# Patient Record
Sex: Female | Born: 1995 | Race: Black or African American | Hispanic: No | Marital: Single | Smoking: Former smoker
Health system: Southern US, Community
[De-identification: ages and names within clinical notes are randomized; demographics above are authoritative.]

---

## 2016-02-07 ENCOUNTER — Encounter (HOSPITAL_COMMUNITY): Payer: Self-pay

## 2016-02-07 ENCOUNTER — Emergency Department (HOSPITAL_COMMUNITY)
Admission: EM | Admit: 2016-02-07 | Discharge: 2016-02-07 | Disposition: A | Discharge status: Home / Self Care | Payer: Medicaid Other | Attending: Physician Assistant | Admitting: Physician Assistant

## 2016-02-07 DIAGNOSIS — Z87891 Personal history of nicotine dependence: Secondary | ICD-10-CM | POA: Diagnosis not present

## 2016-02-07 DIAGNOSIS — N3001 Acute cystitis with hematuria: Secondary | ICD-10-CM | POA: Insufficient documentation

## 2016-02-07 DIAGNOSIS — R300 Dysuria: Secondary | ICD-10-CM | POA: Diagnosis present

## 2016-02-07 LAB — URINALYSIS, ROUTINE W REFLEX MICROSCOPIC
GLUCOSE, UA: NEGATIVE mg/dL
Ketones, ur: 15 mg/dL — AB
NITRITE: POSITIVE — AB
PH: 6 (ref 5.0–8.0)
Protein, ur: 100 mg/dL — AB
SPECIFIC GRAVITY, URINE: 1.036 — AB (ref 1.005–1.030)

## 2016-02-07 LAB — URINE MICROSCOPIC-ADD ON

## 2016-02-07 MED ORDER — CEPHALEXIN 250 MG PO CAPS
500.0000 mg | ORAL_CAPSULE | Freq: Once | ORAL | Status: AC
  Administered 2016-02-07: 500 mg via ORAL
  Filled 2016-02-07: qty 2

## 2016-02-07 MED ORDER — CEPHALEXIN 500 MG PO CAPS: 500.0000 mg | ORAL_CAPSULE | Freq: Four times a day (QID) | ORAL | 0 refills | Status: AC

## 2016-02-07 MED ORDER — PHENAZOPYRIDINE HCL 200 MG PO TABS: 200.0000 mg | ORAL_TABLET | Freq: Three times a day (TID) | ORAL | 0 refills | Status: AC

## 2016-02-07 NOTE — ED Triage Notes (Signed)
Onset 2-3 days ago dysuria.  No back pain, fever, or back pain.  Took ibuprofen @ 2p with no relief.

## 2016-02-07 NOTE — Discharge Instructions (Signed)
You're diagnosed urinary tract infection. Please fill and take prescription. Please return with any fevers, nausea, vomiting or other concerns.

## 2016-02-07 NOTE — ED Provider Notes (Signed)
[facility]-EMERGENCY DEPT Provider Note   CSN: [id number] Arrival date & time: 02/07/16  1634  By signing my name below, I, Alyssa GroveMartin Green, attest that this documentation has been prepared under the direction and in the presence of Jeff Frieden Randall AnLyn Maloni Musleh, MD. Electronically Signed: Alyssa GroveMartin Green, ED Scribe. 02/07/16. 6:09 PM.   History   Chief Complaint Chief Complaint  Patient presents with  . Dysuria   The history is provided by the patient. No language interpreter was used.   HPI Comments: Tally JoeKyja Dacey is a 20 y.o. female who presents to the Emergency Department complaining of dysuria onset 2-3 days. She reports associated abdominal spasms. Pt deies nausea, vomiting, fever, back pain, abnormal discharge, unusual urine odor, or itch. She has not taken any OTC medications.  History reviewed. No pertinent past medical history.  There are no active problems to display for this patient.   History reviewed. No pertinent surgical history.  OB History    No data available       Home Medications    Prior to Admission medications   Not on File    Family History History reviewed. No pertinent family history.  Social History Social History  Substance Use Topics  . Smoking status: Former Games developermoker  . Smokeless tobacco: Never Used  . Alcohol use Not on file     Allergies   Patient has no known allergies.   Review of Systems Review of Systems  Constitutional: Negative for fever.  Gastrointestinal: Negative for nausea and vomiting.  Genitourinary: Positive for dysuria. Negative for vaginal discharge.  Musculoskeletal: Negative for back pain.  All other systems reviewed and are negative.    Physical Exam Updated Vital Signs BP 137/89   Pulse (!) 56   Temp 98.6 F (37 C) (Oral)   Resp 16   Ht 5' 6.5" (1.689 m)   Wt 203 lb (92.1 kg)   LMP 02/07/2016   SpO2 100%   BMI 32.27 kg/m   Physical Exam  Constitutional: She is oriented to person, place, and time. She appears  well-developed and well-nourished. She is active. No distress.  HENT:  Head: Normocephalic and atraumatic.  Eyes: Conjunctivae are normal.  Cardiovascular: Normal rate and regular rhythm.   Pulmonary/Chest: Effort normal and breath sounds normal. No respiratory distress.  Abdominal: Soft. She exhibits no distension. There is no tenderness.  Musculoskeletal: Normal range of motion.  Neurological: She is alert and oriented to person, place, and time.  Skin: Skin is warm and dry.  Psychiatric: She has a normal mood and affect. Her behavior is normal.  Nursing note and vitals reviewed.  ED Treatments / Results  DIAGNOSTIC STUDIES: Oxygen Saturation is 100% on RA, normal by my interpretation.    COORDINATION OF CARE: 6:09 PM Discussed treatment plan with pt at bedside which includes Keflex and pt agreed to plan.  Labs (all labs ordered are listed, but only abnormal results are displayed) Labs Reviewed  URINALYSIS, ROUTINE W REFLEX MICROSCOPIC (NOT AT [facility])    EKG  EKG Interpretation None       Radiology No results found.  Procedures Procedures (including critical care time)  Medications Ordered in ED Medications - No data to display   Initial Impression / Assessment and Plan / ED Course  I have reviewed the triage vital signs and the nursing notes.  Pertinent labs & imaging results that were available during my care of the patient were reviewed by me and considered in my medical decision making (see chart for details).  Clinical Course    I personally performed the services described in this documentation, which was scribed in my presence. The recorded information has been reviewed and is accurate.    Patient is a well-appearing 20 year old female normal vital signs normal physical exam presenting with a couple days of pain with urination. Patient has no back pain, no unilateral pain, no nausea, no vomiting, no fevers. We'll treat for acute uncomplicated cystitis.  We'll give her an initial dose of Keflex and then plan to continue her at home.   Patient is comfortable, ambulatory, and taking PO at time of discharge.  Patient expressed understanding about return precautions.    Final Clinical Impressions(s) / ED Diagnoses   Final diagnoses:  None    New Prescriptions New Prescriptions   No medications on file     Diavion Labrador Randall AnLyn Summerlyn Fickel, MD 02/07/16 1813

## 2016-12-30 ENCOUNTER — Emergency Department (HOSPITAL_COMMUNITY): Payer: No Typology Code available for payment source

## 2016-12-30 ENCOUNTER — Emergency Department (HOSPITAL_COMMUNITY)
Admission: EM | Admit: 2016-12-30 | Discharge: 2016-12-30 | Disposition: A | Discharge status: Home / Self Care | Payer: No Typology Code available for payment source | Attending: Emergency Medicine | Admitting: Emergency Medicine

## 2016-12-30 ENCOUNTER — Encounter (HOSPITAL_COMMUNITY): Payer: Self-pay

## 2016-12-30 DIAGNOSIS — Y92410 Unspecified street and highway as the place of occurrence of the external cause: Secondary | ICD-10-CM | POA: Diagnosis not present

## 2016-12-30 DIAGNOSIS — Y939 Activity, unspecified: Secondary | ICD-10-CM | POA: Diagnosis not present

## 2016-12-30 DIAGNOSIS — M25552 Pain in left hip: Secondary | ICD-10-CM | POA: Insufficient documentation

## 2016-12-30 DIAGNOSIS — S0181XA Laceration without foreign body of other part of head, initial encounter: Secondary | ICD-10-CM | POA: Diagnosis not present

## 2016-12-30 DIAGNOSIS — Y999 Unspecified external cause status: Secondary | ICD-10-CM | POA: Insufficient documentation

## 2016-12-30 DIAGNOSIS — S73005A Unspecified dislocation of left hip, initial encounter: Secondary | ICD-10-CM

## 2016-12-30 DIAGNOSIS — Z23 Encounter for immunization: Secondary | ICD-10-CM | POA: Diagnosis not present

## 2016-12-30 DIAGNOSIS — S79912A Unspecified injury of left hip, initial encounter: Secondary | ICD-10-CM | POA: Diagnosis present

## 2016-12-30 DIAGNOSIS — R55 Syncope and collapse: Secondary | ICD-10-CM | POA: Insufficient documentation

## 2016-12-30 LAB — I-STAT BETA HCG BLOOD, ED (MC, WL, AP ONLY): I-stat hCG, quantitative: 5.8 m[IU]/mL — ABNORMAL HIGH (ref <5)

## 2016-12-30 LAB — CBC WITH DIFFERENTIAL/PLATELET
Basophils Absolute: 0 10*3/uL (ref 0.0–0.1)
Basophils Relative: 0 %
Eosinophils Absolute: 0.1 10*3/uL (ref 0.0–0.7)
Eosinophils Relative: 1 %
HCT: 38.4 % (ref 36.0–46.0)
Hemoglobin: 13.4 g/dL (ref 12.0–15.0)
Lymphocytes Relative: 13 %
Lymphs Abs: 2.1 10*3/uL (ref 0.7–4.0)
MCH: 31.5 pg (ref 26.0–34.0)
MCHC: 34.9 g/dL (ref 30.0–36.0)
MCV: 90.1 fL (ref 78.0–100.0)
Monocytes Absolute: 0.8 10*3/uL (ref 0.1–1.0)
Monocytes Relative: 5 %
Neutro Abs: 12.8 10*3/uL — ABNORMAL HIGH (ref 1.7–7.7)
Neutrophils Relative %: 81 %
Platelets: 295 10*3/uL (ref 150–400)
RBC: 4.26 MIL/uL (ref 3.87–5.11)
RDW: 13 % (ref 11.5–15.5)
WBC: 15.8 10*3/uL — ABNORMAL HIGH (ref 4.0–10.5)

## 2016-12-30 LAB — COMPREHENSIVE METABOLIC PANEL
ALT: 18 U/L (ref 14–54)
AST: 29 U/L (ref 15–41)
Albumin: 4.3 g/dL (ref 3.5–5.0)
Alkaline Phosphatase: 78 U/L (ref 38–126)
Anion gap: 11 (ref 5–15)
BUN: 11 mg/dL (ref 6–20)
CO2: 23 mmol/L (ref 22–32)
Calcium: 9.3 mg/dL (ref 8.9–10.3)
Chloride: 100 mmol/L — ABNORMAL LOW (ref 101–111)
Creatinine, Ser: 0.76 mg/dL (ref 0.44–1.00)
GFR calc Af Amer: >60 mL/min (ref >60)
GFR calc non Af Amer: >60 mL/min (ref >60)
Glucose, Bld: 172 mg/dL — ABNORMAL HIGH (ref 65–99)
Potassium: 3.3 mmol/L — ABNORMAL LOW (ref 3.5–5.1)
Sodium: 134 mmol/L — ABNORMAL LOW (ref 135–145)
Total Bilirubin: 1.3 mg/dL — ABNORMAL HIGH (ref 0.3–1.2)
Total Protein: 8.1 g/dL (ref 6.5–8.1)

## 2016-12-30 LAB — POC URINE PREG, ED: Preg Test, Ur: NEGATIVE

## 2016-12-30 MED ORDER — IOPAMIDOL (ISOVUE-300) INJECTION 61%
INTRAVENOUS | Status: AC
  Administered 2016-12-30: 100 mL
  Filled 2016-12-30: qty 100

## 2016-12-30 MED ORDER — TETANUS-DIPHTH-ACELL PERTUSSIS 5-2.5-18.5 LF-MCG/0.5 IM SUSP
0.5000 mL | Freq: Once | INTRAMUSCULAR | Status: AC
Start: 2016-12-30 — End: 2016-12-30
  Administered 2016-12-30: 0.5 mL via INTRAMUSCULAR
  Filled 2016-12-30: qty 0.5

## 2016-12-30 MED ORDER — HYDROCODONE-ACETAMINOPHEN 5-325 MG PO TABS
1.0000 | ORAL_TABLET | Freq: Once | ORAL | Status: AC
  Administered 2016-12-30: 1 via ORAL
  Filled 2016-12-30: qty 1

## 2016-12-30 MED ORDER — PROPOFOL 10 MG/ML IV BOLUS
0.5000 mg/kg | Freq: Once | INTRAVENOUS | Status: AC
  Administered 2016-12-30: 40 mg via INTRAVENOUS
  Filled 2016-12-30: qty 20

## 2016-12-30 MED ORDER — LIDOCAINE-EPINEPHRINE (PF) 2 %-1:200000 IJ SOLN
20.0000 mL | Freq: Once | INTRAMUSCULAR | Status: AC
  Administered 2016-12-30: 20 mL
  Filled 2016-12-30: qty 20

## 2016-12-30 MED ORDER — MORPHINE SULFATE (PF) 4 MG/ML IV SOLN
4.0000 mg | Freq: Once | INTRAVENOUS | Status: AC
Start: 2016-12-30 — End: 2016-12-30
  Administered 2016-12-30: 4 mg via INTRAVENOUS
  Filled 2016-12-30: qty 1

## 2016-12-30 MED ORDER — KETAMINE HCL 10 MG/ML IJ SOLN
INTRAMUSCULAR | Status: AC | PRN
  Administered 2016-12-30 (×2): 40 mg via INTRAVENOUS

## 2016-12-30 MED ORDER — KETAMINE HCL-SODIUM CHLORIDE 100-0.9 MG/10ML-% IV SOSY
PREFILLED_SYRINGE | INTRAVENOUS | Status: AC
  Filled 2016-12-30: qty 10

## 2016-12-30 MED ORDER — HYDROCODONE-ACETAMINOPHEN 5-325 MG PO TABS: 1.0000 | ORAL_TABLET | ORAL | 0 refills | Status: AC | PRN

## 2016-12-30 MED ORDER — MORPHINE SULFATE (PF) 4 MG/ML IV SOLN
4.0000 mg | Freq: Once | INTRAVENOUS | Status: AC
  Administered 2016-12-30: 4 mg via INTRAVENOUS
  Filled 2016-12-30: qty 1

## 2016-12-30 MED ORDER — PROPOFOL BOLUS VIA INFUSION
0.5000 mg/kg | Freq: Once | INTRAVENOUS | Status: DC
  Filled 2016-12-30: qty 47

## 2016-12-30 MED ORDER — PROPOFOL 10 MG/ML IV BOLUS
INTRAVENOUS | Status: AC | PRN
  Administered 2016-12-30: 30 mg via INTRAVENOUS
  Administered 2016-12-30 (×5): 20 mg via INTRAVENOUS
  Administered 2016-12-30: 30 mg via INTRAVENOUS

## 2016-12-30 MED ORDER — ONDANSETRON HCL 4 MG PO TABS: 4.0000 mg | ORAL_TABLET | Freq: Four times a day (QID) | ORAL | 0 refills | Status: AC

## 2016-12-30 MED ORDER — ETOMIDATE 2 MG/ML IV SOLN
INTRAVENOUS | Status: AC | PRN
  Administered 2016-12-30 (×2): 5 mg via INTRAVENOUS
  Administered 2016-12-30: 15 mg via INTRAVENOUS
  Administered 2016-12-30: 5 mg via INTRAVENOUS
  Administered 2016-12-30: 10 mg via INTRAVENOUS

## 2016-12-30 MED ORDER — ONDANSETRON 4 MG PO TBDP
4.0000 mg | ORAL_TABLET | Freq: Once | ORAL | Status: AC
  Administered 2016-12-30: 4 mg via ORAL
  Filled 2016-12-30: qty 1

## 2016-12-30 MED ORDER — HYDROMORPHONE HCL 1 MG/ML IJ SOLN
1.0000 mg | Freq: Once | INTRAMUSCULAR | Status: AC
  Administered 2016-12-30: 1 mg via INTRAVENOUS
  Filled 2016-12-30: qty 1
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
## 2016-12-30 NOTE — Sedation Documentation (Signed)
Pt's grandmother bedside, family will bring pt clothes before discharge. Grandmother Lucendia Herrlich(Faye) cell: [phone]

## 2016-12-30 NOTE — Progress Notes (Signed)
RT placed pt on a [place] with ETCO2 for conscious sedation procedure.
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
## 2016-12-30 NOTE — Discharge Instructions (Signed)
Please read attached information. If you experience any new or worsening signs or symptoms please return to the emergency room for evaluation. Please follow-up with your primary care provider or specialist as discussed. Please use medication prescribed only as directed and discontinue taking if you have any concerning signs or symptoms.   °

## 2016-12-30 NOTE — Sedation Documentation (Signed)
Having difficulty sedating patient. Total of 100 mg propofol given thus far without adequate sedation for EDP to perform procedure.

## 2016-12-30 NOTE — Progress Notes (Signed)
Orthopedic Tech Progress Note Patient Details:  Jennifer Edwards 05/22/1995 [id number]  Ortho Devices Type of Ortho Device: Knee Immobilizer, Crutches Ortho Device/Splint Interventions: Application   Jennifer Edwards 12/30/2016, 3:43 PM

## 2016-12-30 NOTE — Sedation Documentation (Signed)
MD bedside educating pt on discharge instructions, sister bedside as well.

## 2016-12-30 NOTE — ED Notes (Signed)
Consent obtained from mother and patient for procedure
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
## 2016-12-30 NOTE — ED Notes (Signed)
Pt placed on purwick at this time, family bedside

## 2016-12-30 NOTE — Sedation Documentation (Signed)
Procedure complete, xray bedside to verify placement

## 2016-12-30 NOTE — Sedation Documentation (Signed)
Cardama MD requesting etomidate for further sedation, patient not adequately sedated with 200 mg propofol.

## 2016-12-30 NOTE — ED Notes (Signed)
Patient verbalizes understanding of discharge instructions. Opportunity for questioning and answers were provided. 

## 2016-12-30 NOTE — ED Notes (Signed)
MD, respiratory, and ortho in for procedure Timeout completed
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
